# Patient Record
Sex: Female | Born: 1960 | Race: White | Hispanic: No | Smoking: Current every day smoker
Health system: Southern US, Community
[De-identification: ages and names within clinical notes are randomized; demographics above are authoritative.]

---

## 2006-07-26 HISTORY — PX: BREAST EXCISIONAL BIOPSY: SUR124

## 2010-07-26 HISTORY — PX: BREAST EXCISIONAL BIOPSY: SUR124

## 2016-10-05 ENCOUNTER — Other Ambulatory Visit: Payer: Self-pay | Admitting: *Deleted

## 2016-10-05 ENCOUNTER — Inpatient Hospital Stay
Admission: RE | Admit: 2016-10-05 | Discharge: 2016-10-05 | Disposition: A | Discharge status: Home / Self Care | Payer: Self-pay | Source: Ambulatory Visit | Attending: *Deleted | Admitting: *Deleted

## 2016-10-05 DIAGNOSIS — Z9289 Personal history of other medical treatment: Secondary | ICD-10-CM

## 2016-10-13 ENCOUNTER — Encounter (INDEPENDENT_AMBULATORY_CARE_PROVIDER_SITE_OTHER): Payer: Self-pay

## 2016-10-13 ENCOUNTER — Ambulatory Visit
Admission: RE | Admit: 2016-10-13 | Discharge: 2016-10-13 | Disposition: A | Discharge status: Home / Self Care | Payer: Self-pay | Source: Ambulatory Visit | Attending: Oncology | Admitting: Oncology

## 2016-10-13 ENCOUNTER — Ambulatory Visit: Payer: Self-pay | Attending: Oncology

## 2016-10-13 VITALS — BP 132/92 | HR 114 | Temp 98.4°F | Ht 66.93 in | Wt 198.2 lb

## 2016-10-13 DIAGNOSIS — Z0000 Encounter for general adult medical examination without abnormal findings: Secondary | ICD-10-CM

## 2016-10-13 DIAGNOSIS — N644 Mastodynia: Secondary | ICD-10-CM

## 2016-10-13 NOTE — Progress Notes (Signed)
Subjective:     Patient ID: Courtney Mcfarland, female   DOB: May 21, 1961, 56 y.o.   MRN: [id number]  HPI   Review of Systems     Objective:   Physical Exam  Pulmonary/Chest: Right breast exhibits tenderness. Right breast exhibits no inverted nipple, no mass, no nipple discharge and no skin change. Left breast exhibits no inverted nipple, no mass, no nipple discharge, no skin change and no tenderness. Breasts are symmetrical.    Benign left breast biopsies > 10 year ago       Assessment:     56 year old patient presents for [facility] clinic visit with complaint of right retroareolar pain x 2 months.  History of left breast mastitis "that began with similar symptoms per patient ."  Patient screened, and meets [facility] eligibility.  Patient does not have insurance, Medicare or Medicaid.  Handout given on Affordable Care Act.Instructed patient on breast self-exam using teach back method. CBE reveals increased pain on  retroareolar palpation, otherwise normal breast exam.  Normal pelvic exam.    Plan:     Sent for bilateral diagnostic mammogram.  Specimen collected for pap.

## 2016-10-14 ENCOUNTER — Other Ambulatory Visit: Payer: Self-pay

## 2016-10-14 DIAGNOSIS — N63 Unspecified lump in unspecified breast: Secondary | ICD-10-CM

## 2016-10-15 LAB — PAP LB AND HPV HIGH-RISK
HPV, high-risk: NEGATIVE
PAP Smear Comment: 0

## 2016-10-22 ENCOUNTER — Ambulatory Visit
Admission: RE | Admit: 2016-10-22 | Discharge: 2016-10-22 | Disposition: A | Discharge status: Home / Self Care | Payer: Self-pay | Source: Ambulatory Visit | Attending: Oncology | Admitting: Oncology

## 2016-10-22 DIAGNOSIS — N63 Unspecified lump in unspecified breast: Secondary | ICD-10-CM

## 2016-10-25 LAB — SURGICAL PATHOLOGY

## 2016-10-26 ENCOUNTER — Emergency Department
Admission: EM | Admit: 2016-10-26 | Discharge: 2016-10-26 | Disposition: A | Discharge status: Home / Self Care | Payer: Self-pay | Attending: Emergency Medicine | Admitting: Emergency Medicine

## 2016-10-26 ENCOUNTER — Encounter: Payer: Self-pay | Admitting: Emergency Medicine

## 2016-10-26 DIAGNOSIS — F329 Major depressive disorder, single episode, unspecified: Secondary | ICD-10-CM | POA: Insufficient documentation

## 2016-10-26 DIAGNOSIS — F32A Depression, unspecified: Secondary | ICD-10-CM

## 2016-10-26 DIAGNOSIS — Z5181 Encounter for therapeutic drug level monitoring: Secondary | ICD-10-CM | POA: Insufficient documentation

## 2016-10-26 DIAGNOSIS — F17200 Nicotine dependence, unspecified, uncomplicated: Secondary | ICD-10-CM | POA: Insufficient documentation

## 2016-10-26 LAB — URINE DRUG SCREEN, QUALITATIVE (ARMC ONLY)
Amphetamines, Ur Screen: NOT DETECTED
BARBITURATES, UR SCREEN: NOT DETECTED
Benzodiazepine, Ur Scrn: POSITIVE — AB
CANNABINOID 50 NG, UR ~~LOC~~: NOT DETECTED
COCAINE METABOLITE, UR ~~LOC~~: NOT DETECTED
MDMA (ECSTASY) UR SCREEN: NOT DETECTED
Methadone Scn, Ur: NOT DETECTED
OPIATE, UR SCREEN: NOT DETECTED
Phencyclidine (PCP) Ur S: NOT DETECTED
TRICYCLIC, UR SCREEN: NOT DETECTED

## 2016-10-26 LAB — CBC
HEMATOCRIT: 49.8 % — AB (ref 35.0–47.0)
HEMOGLOBIN: 16.7 g/dL — AB (ref 12.0–16.0)
MCH: 31.1 pg (ref 26.0–34.0)
MCHC: 33.5 g/dL (ref 32.0–36.0)
MCV: 93 fL (ref 80.0–100.0)
Platelets: 347 10*3/uL (ref 150–440)
RBC: 5.36 MIL/uL — AB (ref 3.80–5.20)
RDW: 12.9 % (ref 11.5–14.5)
WBC: 11.6 10*3/uL — ABNORMAL HIGH (ref 3.6–11.0)

## 2016-10-26 LAB — COMPREHENSIVE METABOLIC PANEL
ALBUMIN: 3.5 g/dL (ref 3.5–5.0)
ALT: 15 U/L (ref 14–54)
AST: 22 U/L (ref 15–41)
Alkaline Phosphatase: 73 U/L (ref 38–126)
Anion gap: 8 (ref 5–15)
BUN: 7 mg/dL (ref 6–20)
CALCIUM: 8.6 mg/dL — AB (ref 8.9–10.3)
CHLORIDE: 107 mmol/L (ref 101–111)
CO2: 20 mmol/L — ABNORMAL LOW (ref 22–32)
CREATININE: 0.83 mg/dL (ref 0.44–1.00)
GFR calc Af Amer: >60 mL/min (ref >60)
GFR calc non Af Amer: >60 mL/min (ref >60)
GLUCOSE: 107 mg/dL — AB (ref 65–99)
Potassium: 3.8 mmol/L (ref 3.5–5.1)
SODIUM: 135 mmol/L (ref 135–145)
Total Bilirubin: 0.5 mg/dL (ref 0.3–1.2)
Total Protein: 6.3 g/dL — ABNORMAL LOW (ref 6.5–8.1)

## 2016-10-26 LAB — SALICYLATE LEVEL

## 2016-10-26 LAB — ETHANOL: Alcohol, Ethyl (B): <5 mg/dL (ref <5)

## 2016-10-26 LAB — ACETAMINOPHEN LEVEL: Acetaminophen (Tylenol), Serum: <10 ug/mL — ABNORMAL LOW (ref 10–30)

## 2016-10-26 MED ORDER — ACETAMINOPHEN 325 MG PO TABS
650.0000 mg | ORAL_TABLET | Freq: Once | ORAL | Status: AC
  Administered 2016-10-26: 650 mg via ORAL
  Filled 2016-10-26: qty 2

## 2016-10-26 NOTE — ED Notes (Signed)
Pt given personal belongings for discharge. Pt given resource and substance abuse meeting schedules.

## 2016-10-26 NOTE — Progress Notes (Signed)
Phoned patient with negative breast biopsy results.  She states radiologist called this morning with results, and recommendation for mammogram in one year.  Negative/negative pap results also given to patient.  Next pap due in 5 years per [facility] guidelines

## 2016-10-26 NOTE — ED Notes (Signed)
SOC in process 

## 2016-10-26 NOTE — ED Triage Notes (Addendum)
Pt to ed with IVC papers with [employer].  Papers state that pt verbalized to [employer] that she was going to kill herself. Pt states she is feeling better, " I just had a bad moment and I don't want to kill myself, I am just depressed"

## 2016-10-26 NOTE — Discharge Instructions (Signed)
You have been seen in the emergency department for a  psychiatric concern. You have been evaluated both medically as well as psychiatrically. Please follow-up with your outpatient resources provided. Return to the emergency department for any worsening symptoms, or any thoughts of hurting yourself or anyone else so that we may attempt to help you. 

## 2016-10-26 NOTE — ED Provider Notes (Signed)
[facility] Emergency Department Provider Note  Time seen: 8:29 PM  I have reviewed the triage vital signs and the nursing notes.   HISTORY  Chief Complaint Psychiatric Evaluation    HPI Courtney Mcfarland is a 56 y.o. female who presents to the emergency department with depression. According to the patient she states her girlfriend broke up with her today she recently had a breast biopsy and is waiting to find out if it is cancers. Patient states today she felt like he was getting too much and she states she had a brief lapse in judgment in which she mentioned that it might be better off if she was not around. Here the patient denies any suicidal ideation.Patient states she immediately regretted saying that and absolutely does not want to hurt herself. States she has grown children who would be devastated if she ever tried to hurt herself. Patient states she was just feeling a little overwhelmed at the time. Patient denies any alcohol or drug use for the past 7 years.  History reviewed. No pertinent past medical history.  There are no active problems to display for this patient.   Past Surgical History:  Procedure Laterality Date  . BREAST EXCISIONAL BIOPSY Left 2008  . BREAST EXCISIONAL BIOPSY Left 2012    Prior to Admission medications   Not on File    No Known Allergies  Family History  Problem Relation Age of Onset  . Breast cancer Neg Hx     Social History Social History  Substance Use Topics  . Smoking status: Current Every Day Smoker  . Smokeless tobacco: Never Used  . Alcohol use No    Review of Systems Constitutional: Negative for fever. Cardiovascular: Negative for chest pain. Respiratory: Negative for shortness of breath. Gastrointestinal: Negative for abdominal pain Neurological: Negative for headache 10-point ROS otherwise negative.  ____________________________________________   PHYSICAL EXAM:  VITAL SIGNS: ED Triage Vitals  [10/26/16 1812]  Enc Vitals Group     BP      Pulse Rate (!) 117     Resp 18     Temp 98.6 F (37 C)     Temp Source Oral     SpO2 97 %     Weight 198 lb (89.8 kg)     Height  (1.676 m)     Head Circumference      Peak Flow      Pain Score 0     Pain Loc      Pain Edu?      Excl. in GC?    Constitutional: Alert and oriented. Well appearing and in no distress. Eyes: Normal exam ENT   Head: Normocephalic and atraumatic.   Mouth/Throat: Mucous membranes are moist. Cardiovascular: Normal rate, regular rhythm. No murmur Respiratory: Normal respiratory effort without tachypnea nor retractions. Breath sounds are clear  Gastrointestinal: Soft and nontender. No distention.   Musculoskeletal: Nontender with normal range of motion in all extremities.  Neurologic:  Normal speech and language. No gross focal neurologic deficits Skin:  Skin is warm, dry and intact.  Psychiatric: Mood and affect are normal. Patient denies any SI or HI.  ____________________________________________   INITIAL IMPRESSION / ASSESSMENT AND PLAN / ED COURSE  Pertinent labs & imaging results that were available during my care of the patient were reviewed by me and considered in my medical decision making (see chart for details).  Patient presents to the emergency department with depression. States she had made a vague SI  statement but immediately regretted saying it. Here the patient is calm, cooperative, denies any suicidal ideation. States she will never hurt herself. Patient's labs are normal. We will have the telemetry psychiatry see the patient if they agree we will discharge the patient home with outpatient follow-up. Patient agreeable to this plan.  Psychiatry has seen the patient and they believe the patient is safe for discharge home with outpatient follow-up. I rescinded the patient's IVC.  ____________________________________________   FINAL CLINICAL IMPRESSION(S) / ED  DIAGNOSES  Depression    Minna Antis, MD 10/26/16 [phone]

## 2018-04-26 IMAGING — US US BREAST*L* LIMITED INC AXILLA
1 series · 13 of 20 positions shown · non-contrast
Comparison: Previous exam(s).

CLINICAL DATA: 55-year-old female presenting for evaluation of the
right breast subareolar pain.The pain has been present for about 2
months. She has history of multiple excisional biopsies in the left
breast for infection.

EXAM:
2D DIGITAL DIAGNOSTIC BILATERAL MAMMOGRAM WITH CAD AND ADJUNCT TOMO
BILATERAL BREAST ULTRASOUND

[Series 1: us breast*left* limited inc axilla · 0.06mm/px · 13 of 20 slices shown]
[im 1/20]
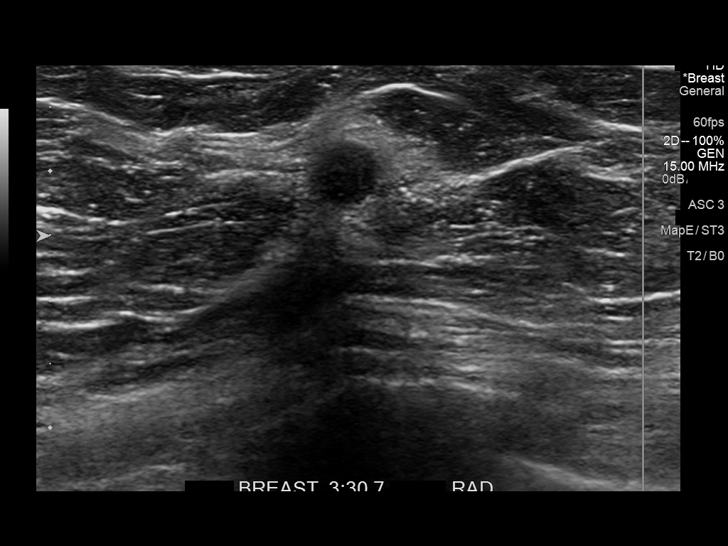
[im 3/20]
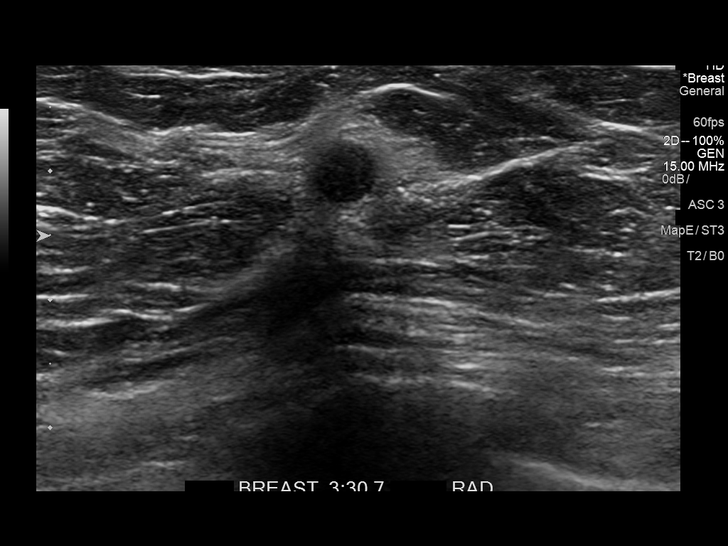
[im 4/20]
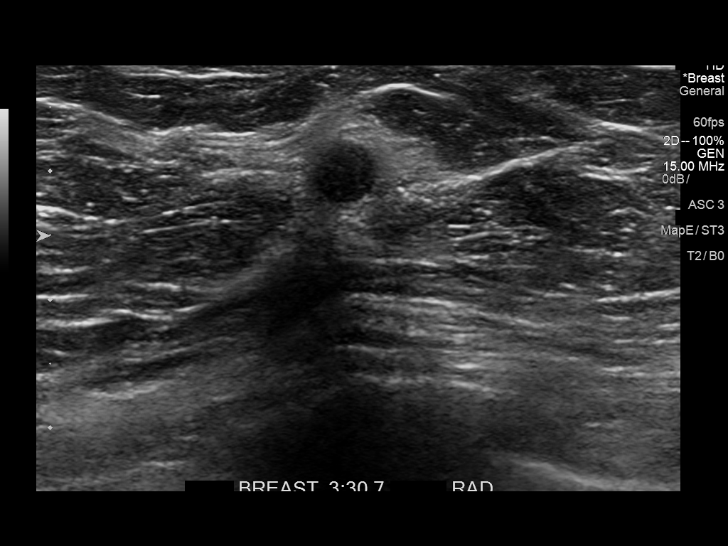
[im 6/20]
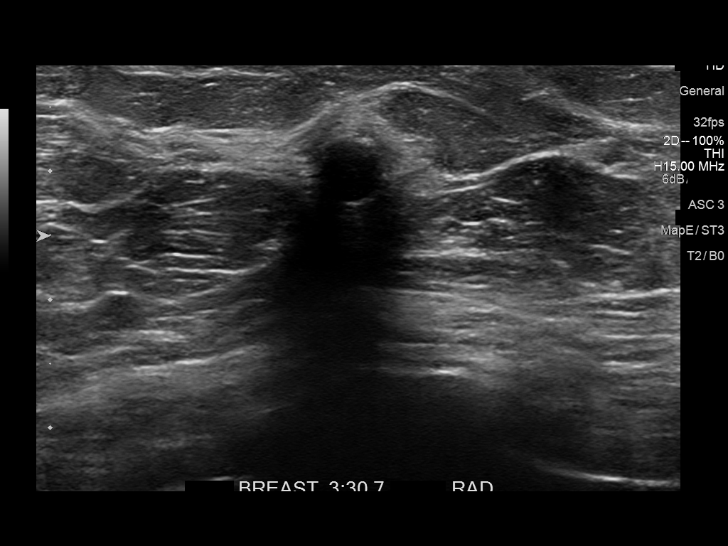
[im 7/20]
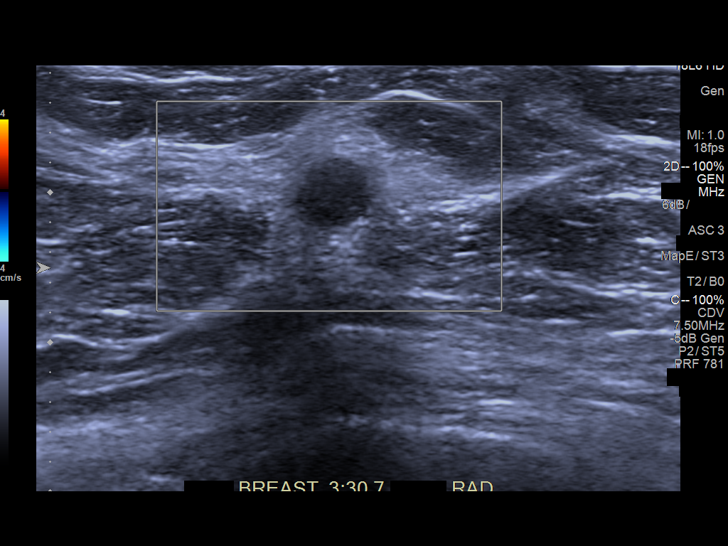
[im 9/20]
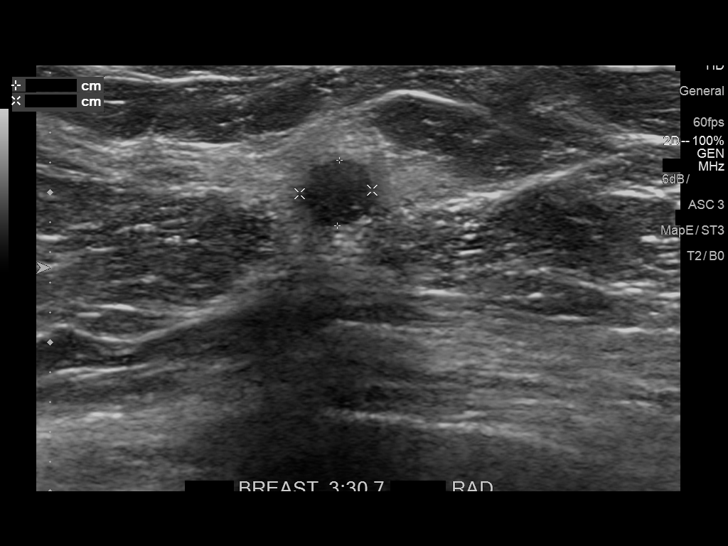
[im 11/20]
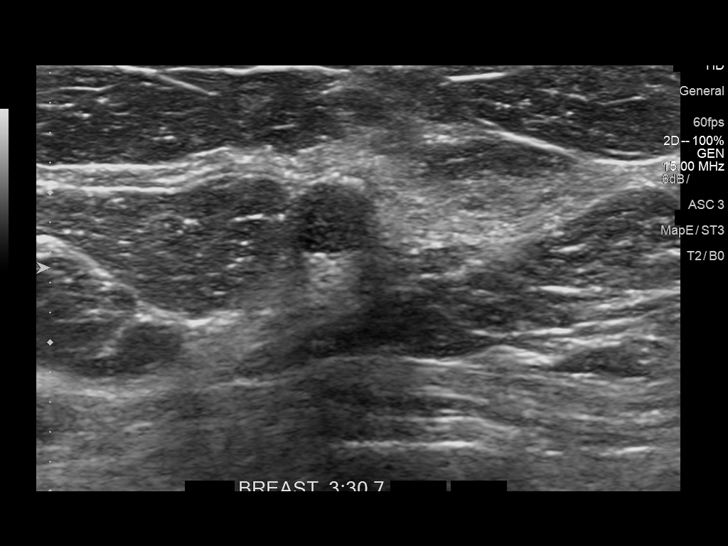
[im 12/20]
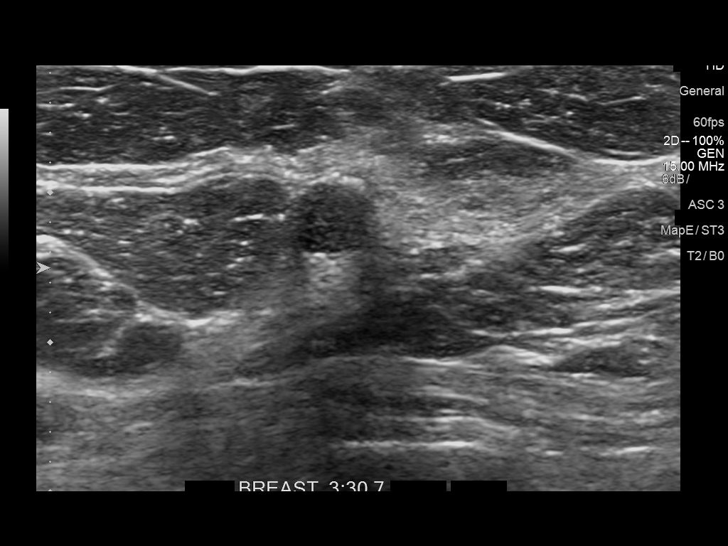
[im 14/20]
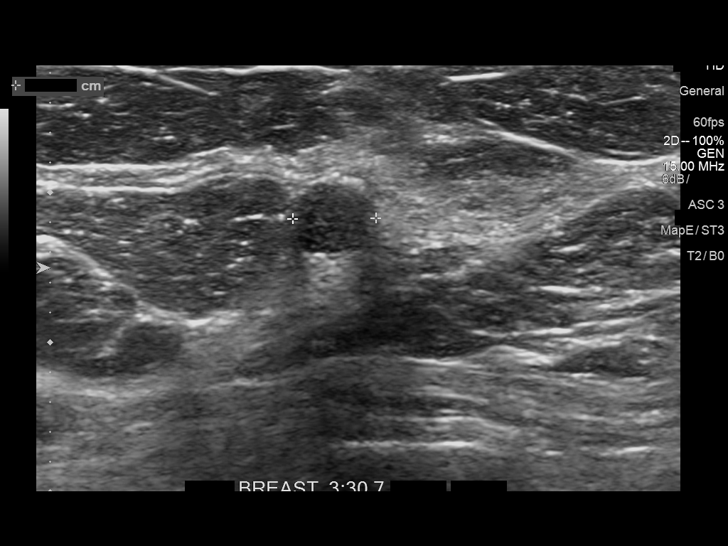
[im 15/20]
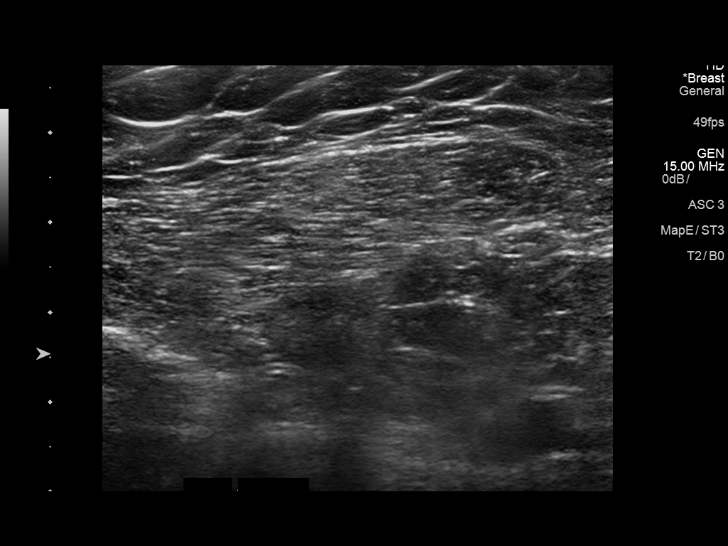
[im 17/20]
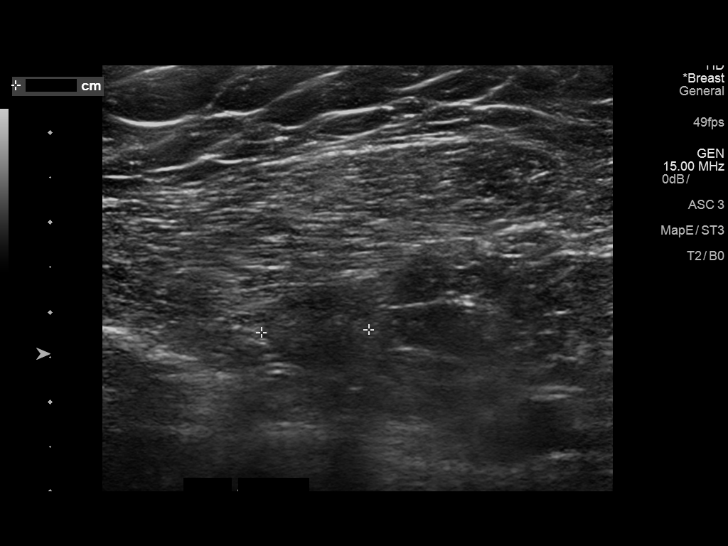
[im 18/20]
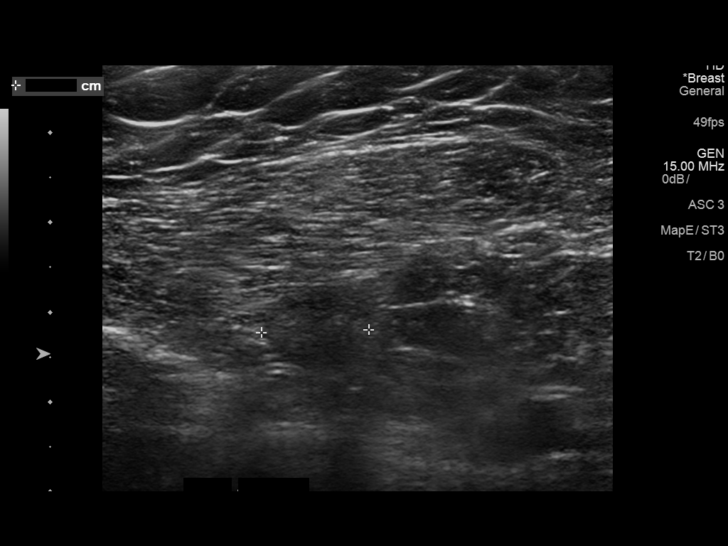
[im 20/20]
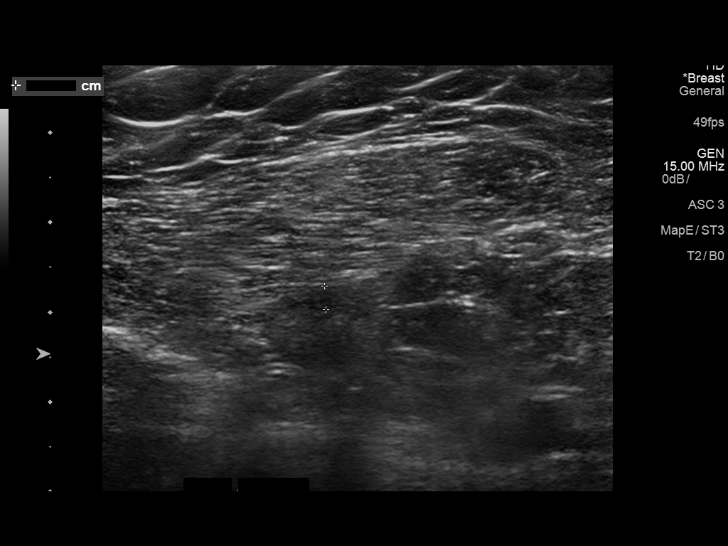

[13 of 20 positions shown; findings below may reference images not displayed]

ACR Breast Density Category b: There are scattered areas of
fibroglandular density.
FINDINGS: In the lower outer quadrant of the left breast, posterior depth
there is a 5 mm oval circumscribed mass. No suspicious mammographic
findings are identified in the subareolar right breast. Multiple
calcifications are seen within the dense tissue in the subareolar
right breast, however this has been present since at least 7627,
with no significant interval change. No suspicious calcifications,
masses or areas of distortion are seen in the bilateral breasts.

Mammographic images were processed with CAD.

Ultrasound of the subareolar right breast demonstrates normal tissue
without suspicious mass or area of shadowing. No dilated ducts are
identified.

Ultrasound of the lower outer left breast at 330, 7 cm from the
nipple demonstrates an oval hypoechoic mass with indistinct margins
measuring 5 x 4 x 6 mm. Ultrasound of the left axilla demonstrates
multiple normal-appearing lymph nodes.
IMPRESSION: 1. There are no mammographic or targeted sonographic abnormalities
in the subareolar right breast to explain the patient's pain.

2.  There is an indeterminate mass in the left breast at [DATE].

3.  No evidence of left axillary lymphadenopathy.

RECOMMENDATION:
1. Ultrasound-guided biopsy is recommended for the left breast mass
at [DATE].

2. Clinical follow-up recommended for the subareolar right breast
pain. Any further workup should be based on clinical grounds.

I have discussed the findings and recommendations with the patient.
Results were also provided in writing at the conclusion of the
visit. If applicable, a reminder letter will be sent to the patient
regarding the next appointment.

BI-RADS CATEGORY  4: Suspicious.

## 2018-12-20 IMAGING — US US BREAST*R* LIMITED INC AXILLA
1 series · 6 of 6 positions shown · non-contrast
Comparison: Previous exam(s).

CLINICAL DATA: 55-year-old female presenting for evaluation of the
right breast subareolar pain.The pain has been present for about 2
months. She has history of multiple excisional biopsies in the left
breast for infection.

EXAM:
2D DIGITAL DIAGNOSTIC BILATERAL MAMMOGRAM WITH CAD AND ADJUNCT TOMO
BILATERAL BREAST ULTRASOUND

[Series 1: us breast*right* limited inc axilla · 0.07mm/px · 6 of 6 slices shown]
[im 1/6]
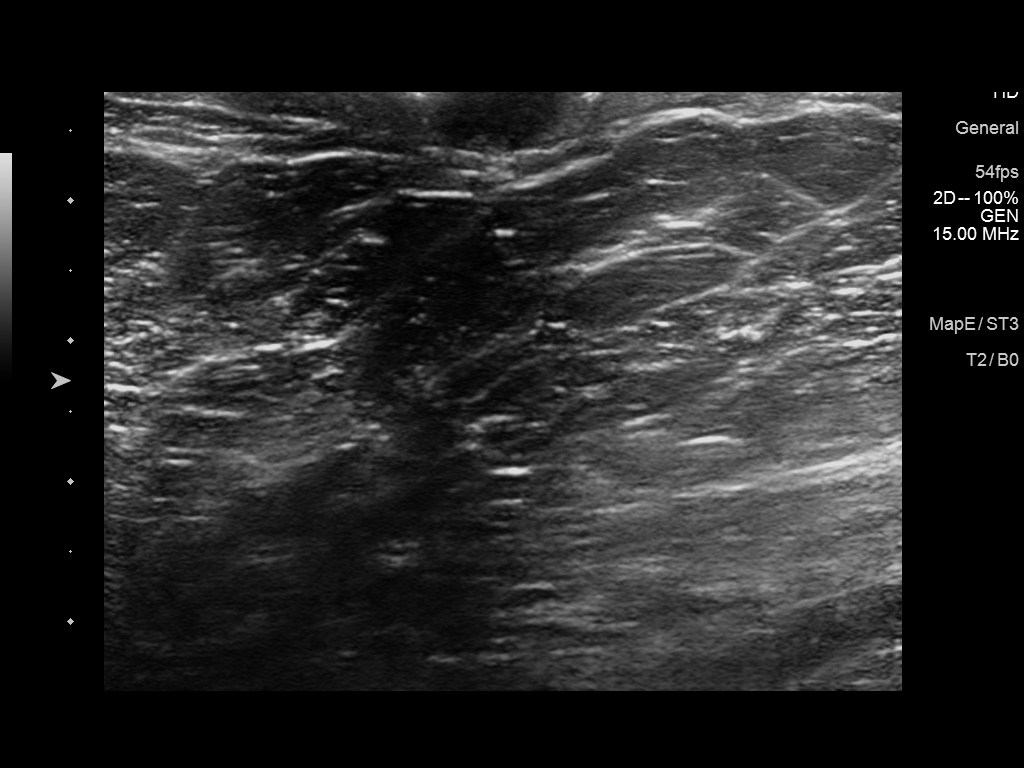
[im 2/6]
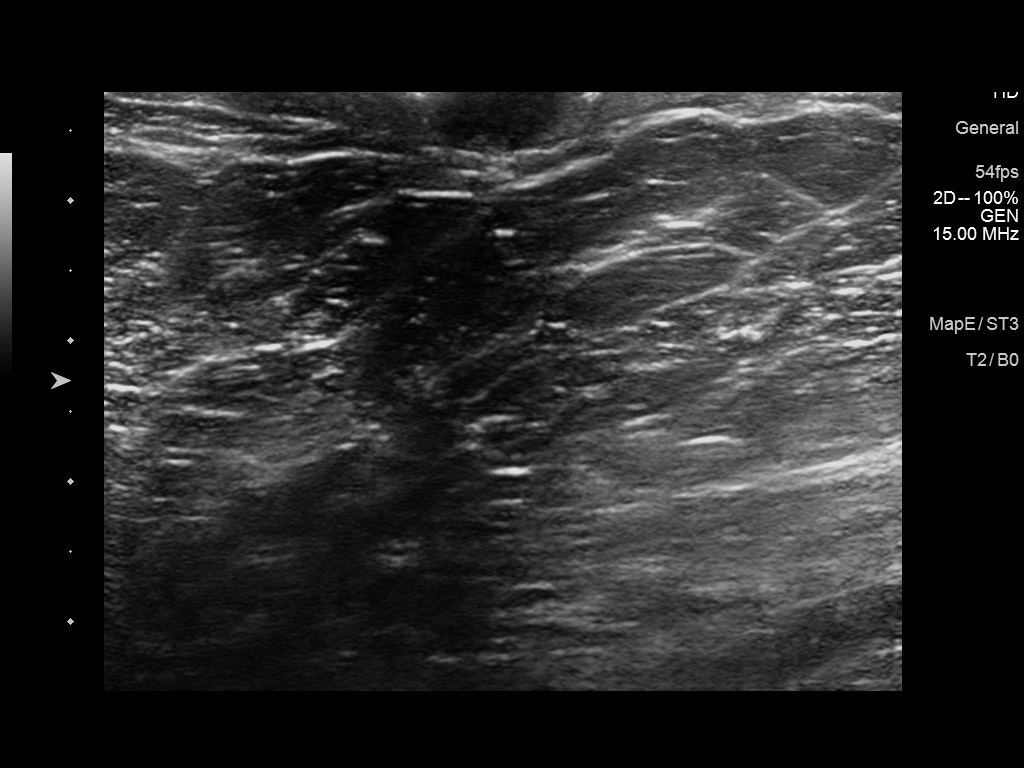
[im 3/6]
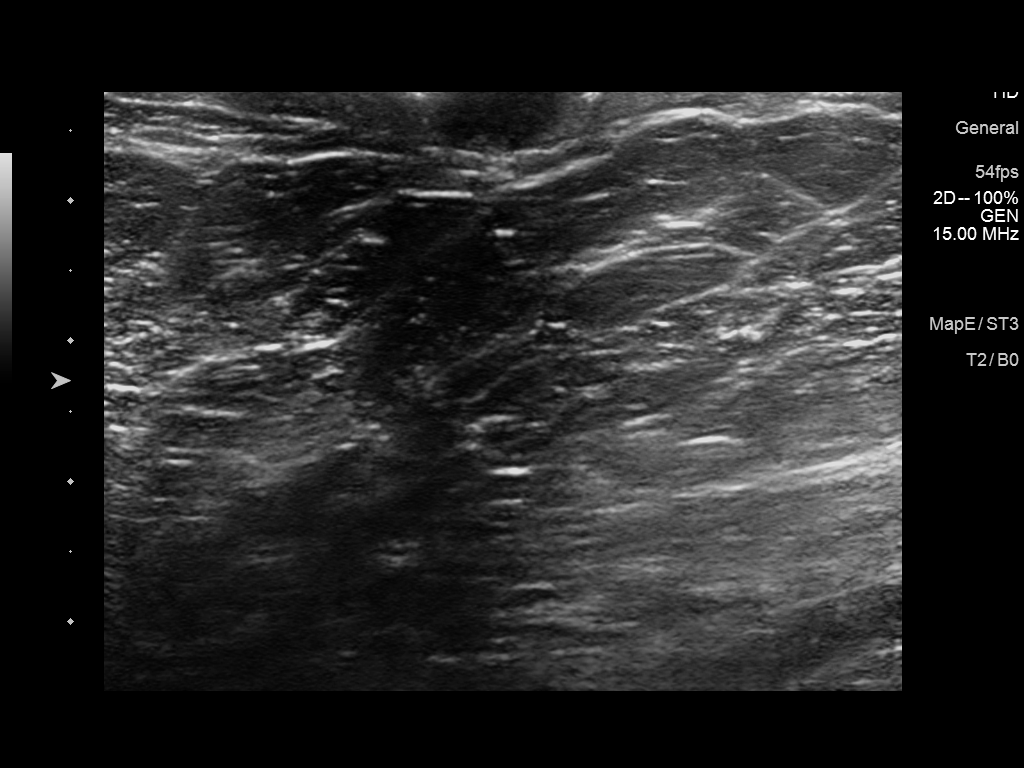
[im 4/6]
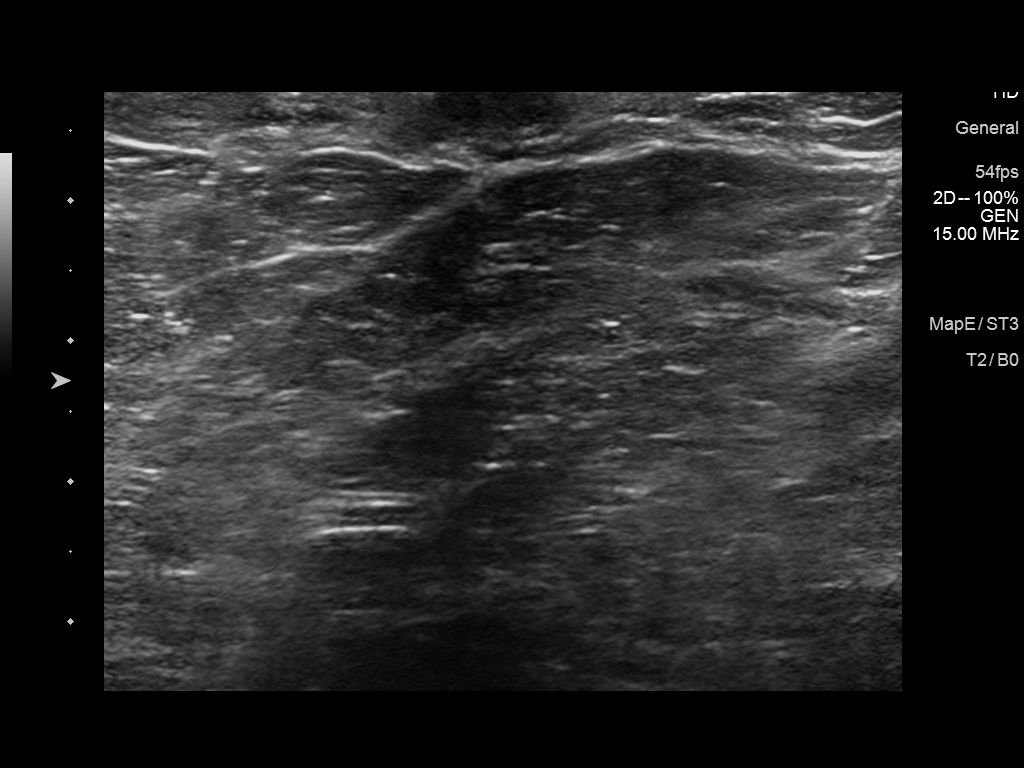
[im 5/6]
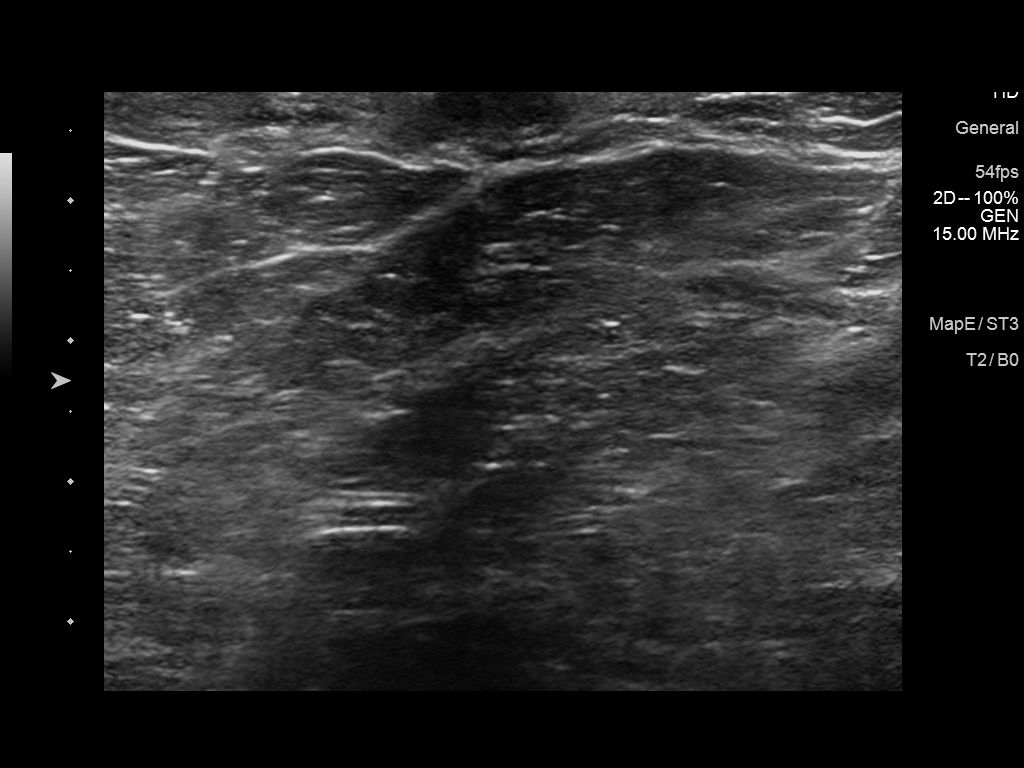
[im 6/6]
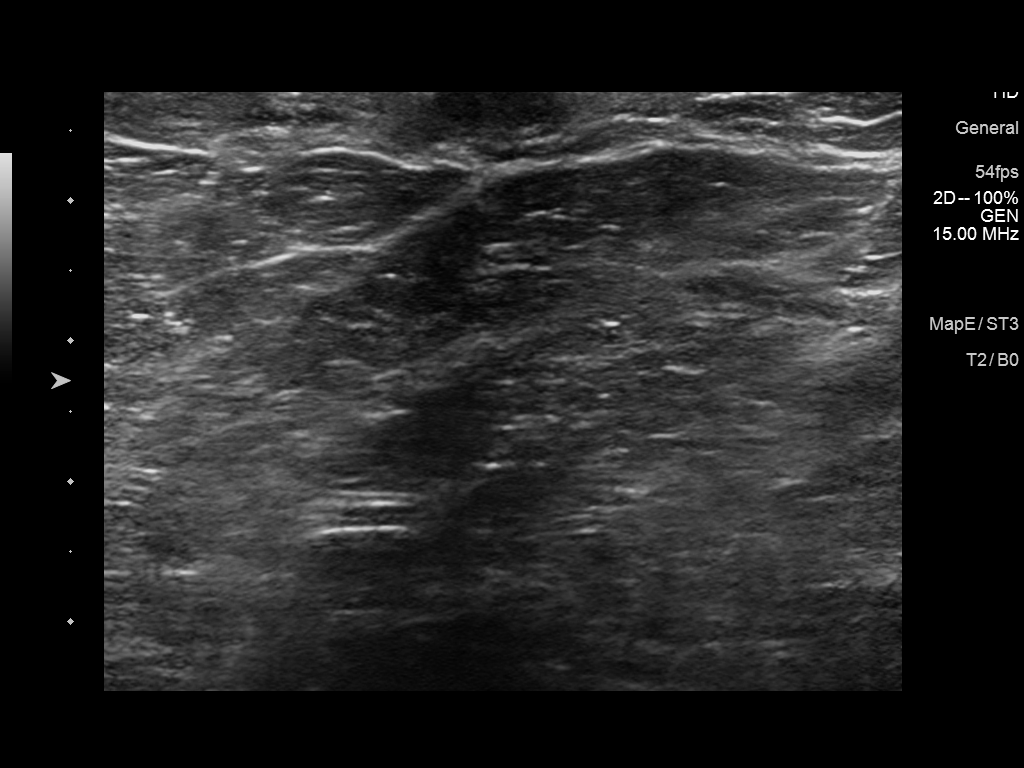

[6 of 6 positions shown; findings below may reference images not displayed]

ACR Breast Density Category b: There are scattered areas of
fibroglandular density.
FINDINGS: In the lower outer quadrant of the left breast, posterior depth
there is a 5 mm oval circumscribed mass. No suspicious mammographic
findings are identified in the subareolar right breast. Multiple
calcifications are seen within the dense tissue in the subareolar
right breast, however this has been present since at least 7627,
with no significant interval change. No suspicious calcifications,
masses or areas of distortion are seen in the bilateral breasts.

Mammographic images were processed with CAD.

Ultrasound of the subareolar right breast demonstrates normal tissue
without suspicious mass or area of shadowing. No dilated ducts are
identified.

Ultrasound of the lower outer left breast at 330, 7 cm from the
nipple demonstrates an oval hypoechoic mass with indistinct margins
measuring 5 x 4 x 6 mm. Ultrasound of the left axilla demonstrates
multiple normal-appearing lymph nodes.
IMPRESSION: 1. There are no mammographic or targeted sonographic abnormalities
in the subareolar right breast to explain the patient's pain.

2.  There is an indeterminate mass in the left breast at [DATE].

3.  No evidence of left axillary lymphadenopathy.

RECOMMENDATION:
1. Ultrasound-guided biopsy is recommended for the left breast mass
at [DATE].

2. Clinical follow-up recommended for the subareolar right breast
pain. Any further workup should be based on clinical grounds.

I have discussed the findings and recommendations with the patient.
Results were also provided in writing at the conclusion of the
visit. If applicable, a reminder letter will be sent to the patient
regarding the next appointment.

BI-RADS CATEGORY  4: Suspicious.

## 2018-12-29 IMAGING — MG US BREAST BX W LOC DEV 1ST LESION IMG BX SPEC US GUIDE*L*
1 series · 8 of 8 positions shown · non-contrast
Comparison: Previous exam(s).

ADDENDUM:
Pathology of the left breast biopsy revealed LEFT BREAST, [DATE], 7
CMFN; BIOPSY: MICROCYSTS AND SCANT CHRONIC INFLAMMATION. NEGATIVE
FOR ATYPIA AND MALIGNANCY. This was found to be concordant by Dr.
Louie impression and notes.

Recommendations:  Return to routine mammography in one year.
At the patient's request, results and recommendations were relayed
to the patient by phone by Tiger, Bino on 10/26/16. The patient
stated she has done well following the biopsy with no bleeding or
hematoma. She does have some bruising. Post biopsy instructions were
reviewed with the patient. All of her questions were answered. She
was encouraged to call the [HOSPITAL] with any further
questions or concerns.
Addendum by Tiger, Bino on 10/26/16.
CLINICAL DATA: Biopsy was suggested of a new 6 mm mass in the [DATE]
position of the left breast.
EXAM:
ULTRASOUND GUIDED LEFT BREAST CORE NEEDLE BIOPSY

[Series 1: MG view · 0.05mm/px · 8 of 18 slices shown]
[im 1/18]
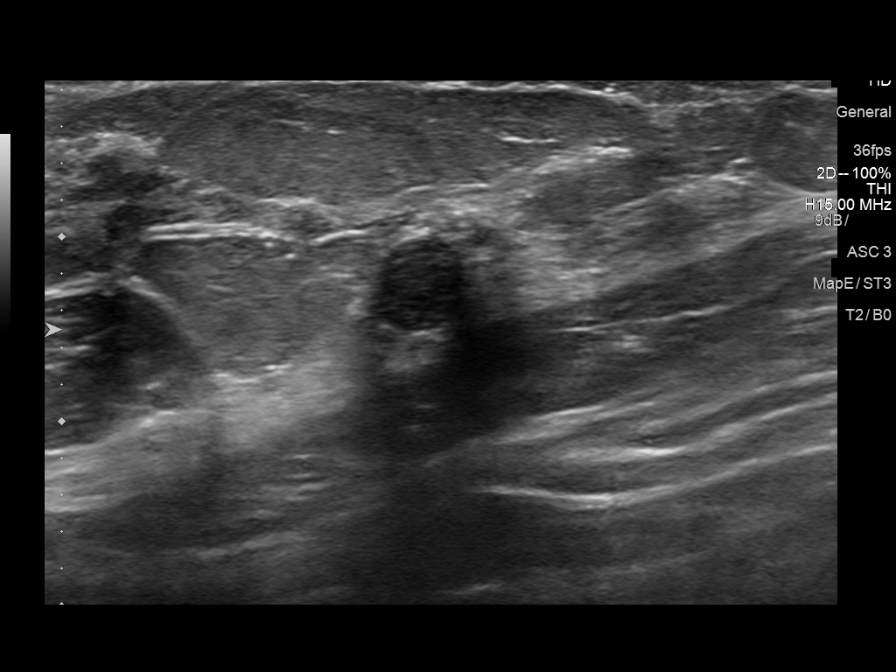
[im 3/18]
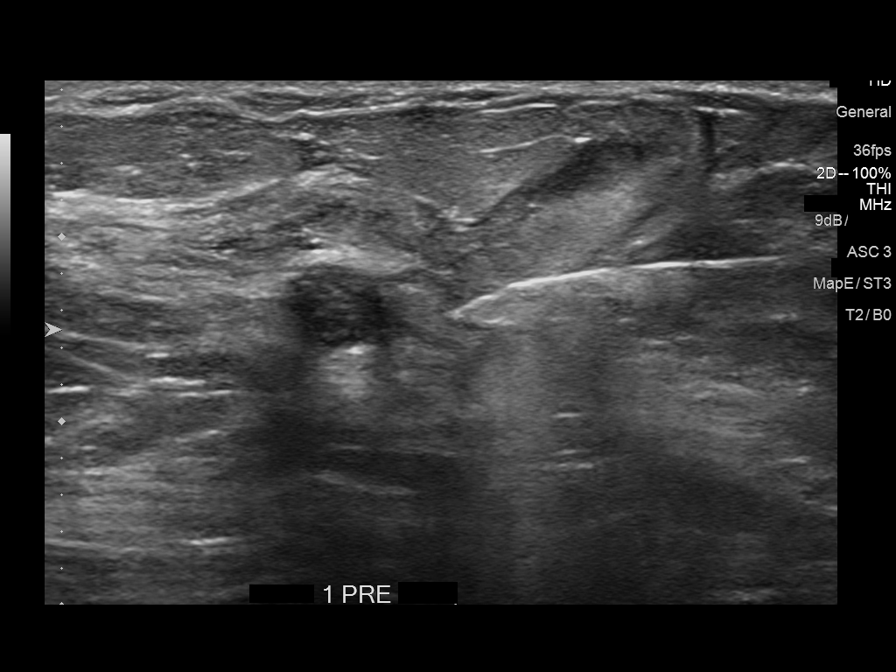
[im 5/18]
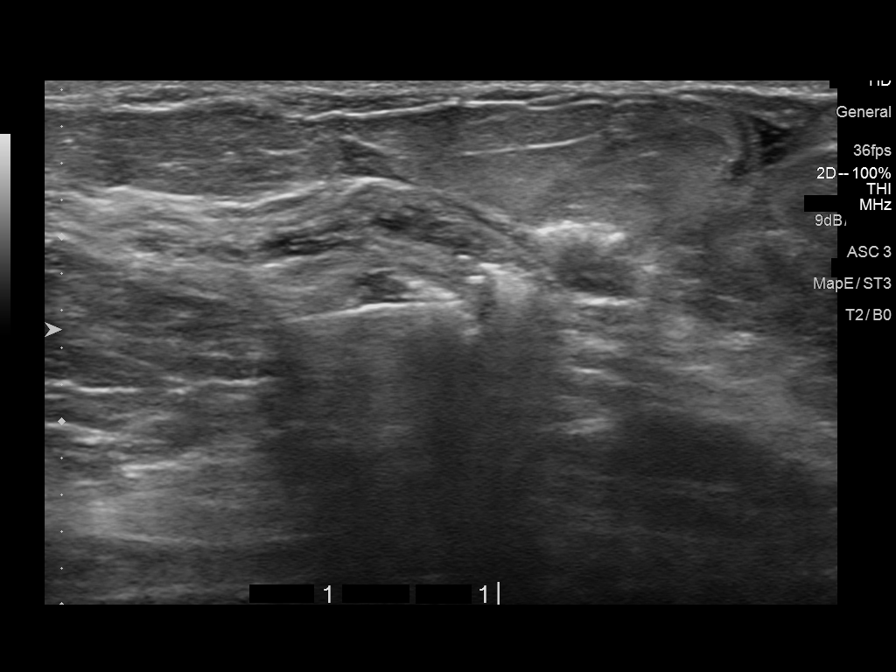
[im 8/18]
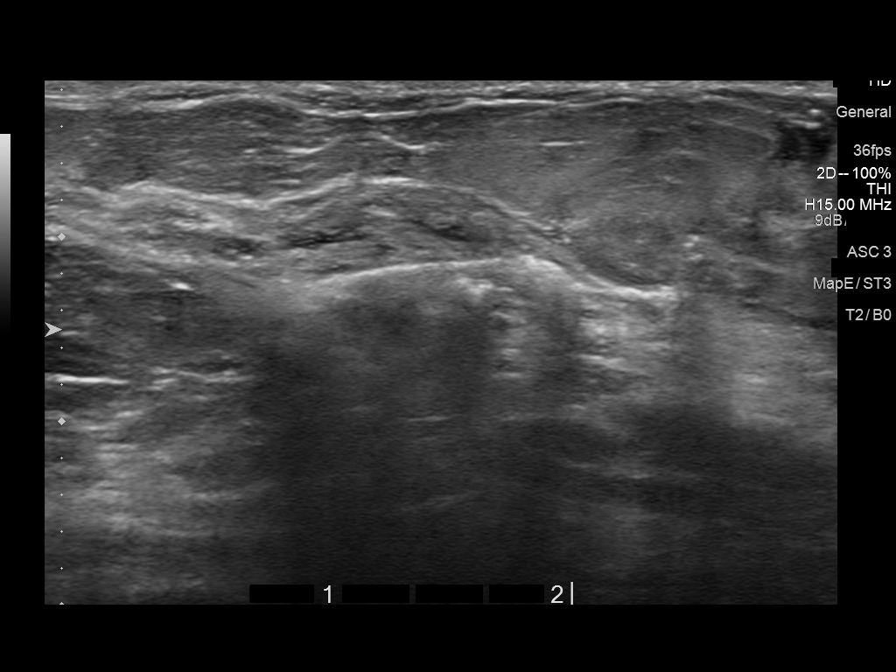
[im 10/18]
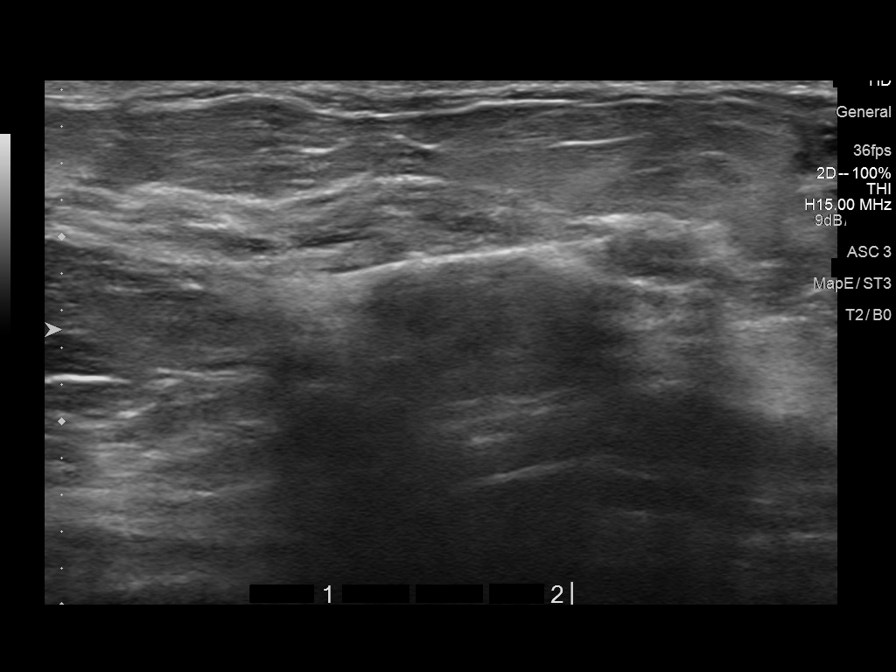
[im 13/18]
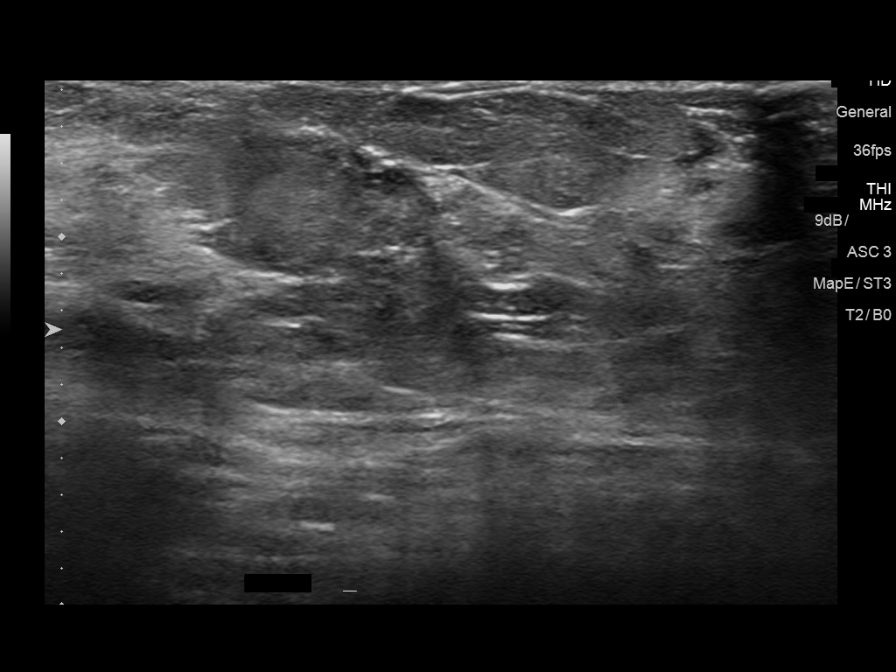
[im 15/18]
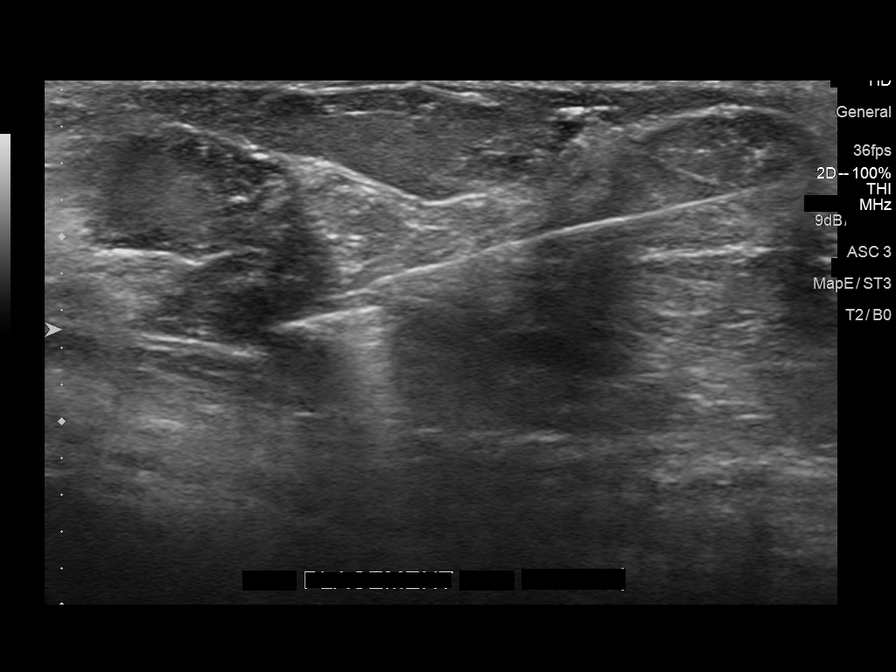
[im 18/18]
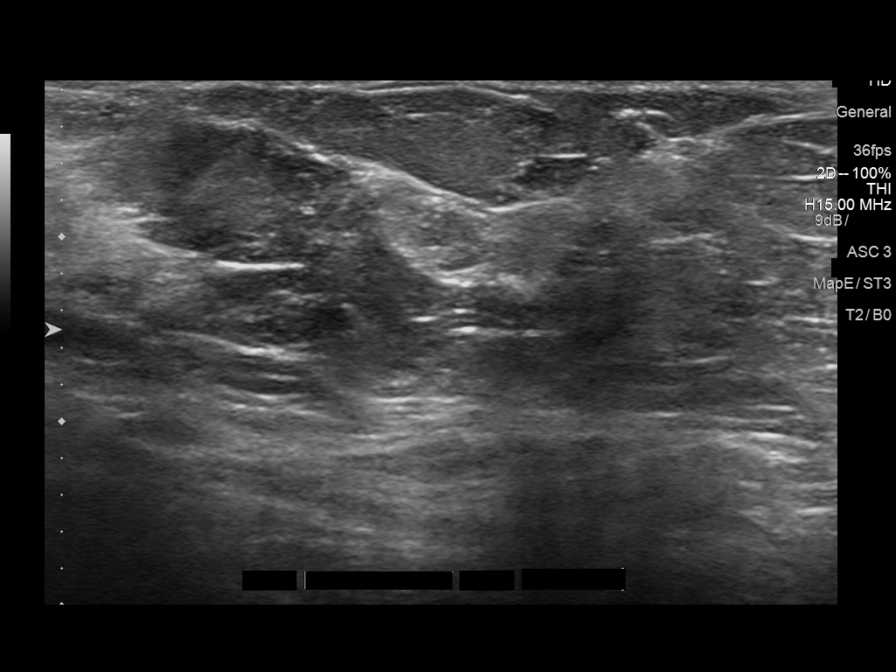

[8 of 8 positions shown; findings below may reference images not displayed]



Using sterile technique and 1% Lidocaine as local anesthetic, under
direct ultrasound visualization, a 14 gauge Huy device was
used to perform biopsy of the mass at [DATE] position in the left
breast using a lateral to medial approach. After 1 pass with the
biopsy needle, the mass significantly decrease in size and changed
in shape. Only 1 biopsy sample was taken. At the conclusion of the
procedure a tissue marker clip was deployed into the biopsy cavity.
Follow up 2 view mammogram was performed and dictated separately.
IMPRESSION: Ultrasound guided biopsy of the left breast. No apparent
complications.
